# Patient Record
Sex: Female | Born: 1985 | Race: White | Hispanic: No | Marital: Single | Smoking: Never smoker
Health system: Southern US, Community
[De-identification: ages and names within clinical notes are randomized; demographics above are authoritative.]

---
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
## 2005-01-14 ENCOUNTER — Emergency Department (HOSPITAL_COMMUNITY): Admission: EM | Admit: 2005-01-14 | Discharge: 2005-01-14 | Payer: Self-pay | Admitting: Emergency Medicine

## 2008-03-05 ENCOUNTER — Emergency Department (HOSPITAL_BASED_OUTPATIENT_CLINIC_OR_DEPARTMENT_OTHER): Admission: EM | Admit: 2008-03-05 | Discharge: 2008-03-05 | Payer: Self-pay | Admitting: Emergency Medicine

## 2009-01-17 ENCOUNTER — Emergency Department (HOSPITAL_BASED_OUTPATIENT_CLINIC_OR_DEPARTMENT_OTHER): Admission: EM | Admit: 2009-01-17 | Discharge: 2009-01-17 | Payer: Self-pay | Admitting: Emergency Medicine

## 2009-04-04 ENCOUNTER — Emergency Department (HOSPITAL_BASED_OUTPATIENT_CLINIC_OR_DEPARTMENT_OTHER): Admission: EM | Admit: 2009-04-04 | Discharge: 2009-04-04 | Payer: Self-pay | Admitting: Emergency Medicine

## 2009-07-31 ENCOUNTER — Emergency Department (HOSPITAL_BASED_OUTPATIENT_CLINIC_OR_DEPARTMENT_OTHER): Admission: EM | Admit: 2009-07-31 | Discharge: 2009-07-31 | Payer: Self-pay | Admitting: Emergency Medicine

## 2009-08-20 ENCOUNTER — Emergency Department (HOSPITAL_BASED_OUTPATIENT_CLINIC_OR_DEPARTMENT_OTHER): Admission: EM | Admit: 2009-08-20 | Discharge: 2009-08-20 | Payer: Self-pay | Admitting: Emergency Medicine

## 2009-11-22 ENCOUNTER — Emergency Department (HOSPITAL_BASED_OUTPATIENT_CLINIC_OR_DEPARTMENT_OTHER): Admission: EM | Admit: 2009-11-22 | Discharge: 2009-11-22 | Payer: Self-pay | Admitting: Emergency Medicine

## 2009-11-22 ENCOUNTER — Ambulatory Visit: Payer: Self-pay | Admitting: Radiology

## 2009-12-28 ENCOUNTER — Emergency Department (HOSPITAL_BASED_OUTPATIENT_CLINIC_OR_DEPARTMENT_OTHER): Admission: EM | Admit: 2009-12-28 | Discharge: 2009-12-28 | Payer: Self-pay | Admitting: Emergency Medicine

## 2010-01-24 ENCOUNTER — Emergency Department (HOSPITAL_BASED_OUTPATIENT_CLINIC_OR_DEPARTMENT_OTHER): Admission: EM | Admit: 2010-01-24 | Discharge: 2010-01-24 | Payer: Self-pay | Admitting: Emergency Medicine

## 2010-03-05 ENCOUNTER — Ambulatory Visit (HOSPITAL_COMMUNITY)
Admission: RE | Admit: 2010-03-05 | Discharge: 2010-03-05 | Payer: Self-pay | Source: Home / Self Care | Attending: Obstetrics and Gynecology | Admitting: Obstetrics and Gynecology

## 2010-03-06 ENCOUNTER — Emergency Department (HOSPITAL_BASED_OUTPATIENT_CLINIC_OR_DEPARTMENT_OTHER)
Admission: EM | Admit: 2010-03-06 | Discharge: 2010-03-06 | Payer: Self-pay | Source: Home / Self Care | Admitting: Emergency Medicine

## 2010-04-02 ENCOUNTER — Emergency Department (HOSPITAL_BASED_OUTPATIENT_CLINIC_OR_DEPARTMENT_OTHER)
Admission: EM | Admit: 2010-04-02 | Discharge: 2010-04-02 | Payer: Self-pay | Source: Home / Self Care | Admitting: Emergency Medicine

## 2010-04-02 LAB — URINALYSIS, ROUTINE W REFLEX MICROSCOPIC
Bilirubin Urine: NEGATIVE
Hemoglobin, Urine: NEGATIVE
Ketones, ur: NEGATIVE mg/dL
Nitrite: NEGATIVE
Protein, ur: NEGATIVE mg/dL
Specific Gravity, Urine: 1.013 (ref 1.005–1.030)
Urine Glucose, Fasting: NEGATIVE mg/dL
Urobilinogen, UA: 0.2 mg/dL (ref 0.0–1.0)
pH: 6 (ref 5.0–8.0)

## 2010-04-14 ENCOUNTER — Emergency Department (HOSPITAL_BASED_OUTPATIENT_CLINIC_OR_DEPARTMENT_OTHER)
Admission: EM | Admit: 2010-04-14 | Discharge: 2010-04-14 | Payer: Self-pay | Source: Home / Self Care | Admitting: Emergency Medicine

## 2010-06-10 LAB — URINE CULTURE
Colony Count: >=100000
Culture  Setup Time: 201110300231

## 2010-06-10 LAB — URINALYSIS, ROUTINE W REFLEX MICROSCOPIC
Bilirubin Urine: NEGATIVE
Glucose, UA: NEGATIVE mg/dL
Hgb urine dipstick: NEGATIVE
Ketones, ur: NEGATIVE mg/dL
Nitrite: NEGATIVE
Protein, ur: NEGATIVE mg/dL
Specific Gravity, Urine: 1.003 — ABNORMAL LOW (ref 1.005–1.030)
Urobilinogen, UA: 0.2 mg/dL (ref 0.0–1.0)
pH: 6 (ref 5.0–8.0)

## 2010-06-10 LAB — URINE MICROSCOPIC-ADD ON

## 2010-06-11 LAB — URINALYSIS, ROUTINE W REFLEX MICROSCOPIC
Bilirubin Urine: NEGATIVE
Glucose, UA: NEGATIVE mg/dL
Ketones, ur: NEGATIVE mg/dL
Leukocytes, UA: NEGATIVE
Nitrite: NEGATIVE
Protein, ur: NEGATIVE mg/dL
Specific Gravity, Urine: 1.021 (ref 1.005–1.030)
Urobilinogen, UA: 0.2 mg/dL (ref 0.0–1.0)
pH: 6.5 (ref 5.0–8.0)

## 2010-06-11 LAB — URINE MICROSCOPIC-ADD ON

## 2010-06-11 LAB — PREGNANCY, URINE: Preg Test, Ur: NEGATIVE

## 2010-06-14 LAB — RAPID STREP SCREEN (MED CTR MEBANE ONLY): Streptococcus, Group A Screen (Direct): NEGATIVE

## 2010-06-15 ENCOUNTER — Emergency Department (HOSPITAL_BASED_OUTPATIENT_CLINIC_OR_DEPARTMENT_OTHER)
Admission: EM | Admit: 2010-06-15 | Discharge: 2010-06-16 | Disposition: A | Discharge status: Home / Self Care | Payer: Medicaid Other | Attending: Emergency Medicine | Admitting: Emergency Medicine

## 2010-06-15 DIAGNOSIS — R209 Unspecified disturbances of skin sensation: Secondary | ICD-10-CM | POA: Insufficient documentation

## 2010-06-15 DIAGNOSIS — J45909 Unspecified asthma, uncomplicated: Secondary | ICD-10-CM | POA: Insufficient documentation

## 2010-06-15 DIAGNOSIS — O2690 Pregnancy related conditions, unspecified, unspecified trimester: Secondary | ICD-10-CM | POA: Insufficient documentation

## 2010-06-16 LAB — BASIC METABOLIC PANEL
BUN: 4 mg/dL — ABNORMAL LOW (ref 6–23)
CO2: 24 mEq/L (ref 19–32)
Calcium: 8.3 mg/dL — ABNORMAL LOW (ref 8.4–10.5)
Chloride: 105 mEq/L (ref 96–112)
Creatinine, Ser: 0.7 mg/dL (ref 0.4–1.2)
GFR calc Af Amer: >60 mL/min (ref >60)
GFR calc non Af Amer: >60 mL/min (ref >60)
Glucose, Bld: 92 mg/dL (ref 70–99)
Potassium: 4.2 mEq/L (ref 3.5–5.1)
Sodium: 139 mEq/L (ref 135–145)

## 2010-06-16 LAB — PREGNANCY, URINE: Preg Test, Ur: NEGATIVE

## 2010-07-02 LAB — URINALYSIS, ROUTINE W REFLEX MICROSCOPIC
Bilirubin Urine: NEGATIVE
Glucose, UA: NEGATIVE mg/dL
Hgb urine dipstick: NEGATIVE
Ketones, ur: NEGATIVE mg/dL
Nitrite: NEGATIVE
Protein, ur: NEGATIVE mg/dL
Specific Gravity, Urine: 1.004 — ABNORMAL LOW (ref 1.005–1.030)
Urobilinogen, UA: 0.2 mg/dL (ref 0.0–1.0)
pH: 6.5 (ref 5.0–8.0)

## 2010-07-02 LAB — PREGNANCY, URINE: Preg Test, Ur: NEGATIVE
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
## 2010-08-15 ENCOUNTER — Inpatient Hospital Stay (HOSPITAL_COMMUNITY)
Admission: EM | Admit: 2010-08-15 | Discharge: 2010-08-15 | Disposition: A | Discharge status: Home / Self Care | Payer: Medicaid Other | Source: Ambulatory Visit | Attending: Obstetrics & Gynecology | Admitting: Obstetrics & Gynecology

## 2010-08-15 DIAGNOSIS — O9989 Other specified diseases and conditions complicating pregnancy, childbirth and the puerperium: Secondary | ICD-10-CM

## 2010-08-15 DIAGNOSIS — O99891 Other specified diseases and conditions complicating pregnancy: Secondary | ICD-10-CM | POA: Insufficient documentation

## 2010-10-18 ENCOUNTER — Emergency Department (HOSPITAL_BASED_OUTPATIENT_CLINIC_OR_DEPARTMENT_OTHER)
Admission: EM | Admit: 2010-10-18 | Discharge: 2010-10-18 | Disposition: A | Discharge status: Home / Self Care | Payer: Medicaid Other | Attending: Emergency Medicine | Admitting: Emergency Medicine

## 2010-10-18 ENCOUNTER — Encounter: Payer: Self-pay | Admitting: Emergency Medicine

## 2010-10-18 DIAGNOSIS — S01502A Unspecified open wound of oral cavity, initial encounter: Secondary | ICD-10-CM

## 2010-10-18 DIAGNOSIS — K1370 Unspecified lesions of oral mucosa: Secondary | ICD-10-CM | POA: Insufficient documentation

## 2010-10-18 DIAGNOSIS — X58XXXA Exposure to other specified factors, initial encounter: Secondary | ICD-10-CM | POA: Insufficient documentation

## 2010-10-18 NOTE — ED Provider Notes (Signed)
History     Chief Complaint  Patient presents with  . Mouth Lesions   HPI Comments: Patient is concerned that she may have oral thrush. Today she is noted possible lesions on her tongue. There is no white coating that she can scrape off. She is able to swallow without difficulty. She has no shortness of breath no feelings of throat closing. She otherwise is completely well.  Patient is a 25 y.o. female presenting with mouth sores. The history is provided by the patient.  Mouth Lesions  The current episode started today. The problem has been unchanged. The problem is mild. The symptoms are relieved by nothing. The symptoms are aggravated by nothing. Associated symptoms include mouth sores. Pertinent negatives include no orthopnea, no fever, no abdominal pain, no diarrhea, no nausea, no vomiting, no headaches, no cough, no URI, no wheezing, no rash, no eye discharge and no eye redness.    Past Medical History  Diagnosis Date  . Endometriosis     Past Surgical History  Procedure Date  . Cesarean section     No family history on file.  History  Substance Use Topics  . Smoking status: Never Smoker   . Smokeless tobacco: Not on file  . Alcohol Use: No    OB History    Grav Para Term Preterm Abortions TAB SAB Ect Mult Living                  Review of Systems  Constitutional: Negative.  Negative for fever and chills.  HENT: Positive for mouth sores.   Eyes: Negative.  Negative for discharge and redness.  Respiratory: Negative.  Negative for cough, shortness of breath and wheezing.   Cardiovascular: Negative.  Negative for chest pain and orthopnea.  Gastrointestinal: Negative.  Negative for nausea, vomiting, abdominal pain and diarrhea.  Genitourinary: Negative.  Negative for dysuria and vaginal discharge.  Musculoskeletal: Negative.  Negative for back pain.  Skin: Negative.  Negative for color change and rash.  Neurological: Negative.  Negative for syncope and headaches.    Hematological: Negative.  Negative for adenopathy.  Psychiatric/Behavioral: Negative.  Negative for confusion.  All other systems reviewed and are negative.    Physical Exam  BP 109/64  Pulse 72  Temp(Src) 98 F (36.7 C) (Oral)  Resp 16  Ht 5\' 6"  (1.676 m)  Wt 149 lb (67.586 kg)  BMI 24.05 kg/m2  SpO2 99%  LMP 10/08/2010  Physical Exam  Constitutional: She is oriented to person, place, and time. She appears well-developed and well-nourished.  HENT:  Head: Normocephalic and atraumatic.       Patient has no white coating across her tongue. No vesicular lesions. No signs of dental infection along her gums. Oropharynx is clear. Uvula midline. No tonsillar exudate or swelling.  Eyes: Conjunctivae and EOM are normal. Pupils are equal, round, and reactive to light.  Neck: Normal range of motion. Neck supple.  Pulmonary/Chest: Effort normal.  Musculoskeletal: Normal range of motion.  Neurological: She is alert and oriented to person, place, and time.  Skin: Skin is warm and dry.  Psychiatric: She has a normal mood and affect. Her behavior is normal. Judgment and thought content normal.    ED Course  Procedures  MDM The patient has no signs of oral thrush at this time. She is otherwise completely well. No signs of any respiratory complaints or distress. I've advised her to followup with her primary care physician or with the dentist.  Nat Christen, MD 10/18/10 [phone]

## 2010-10-18 NOTE — ED Notes (Signed)
Pt c/o "coating" to mouth- denies pain- sts she thinks it is yeast

## 2010-11-22 ENCOUNTER — Encounter (HOSPITAL_BASED_OUTPATIENT_CLINIC_OR_DEPARTMENT_OTHER): Payer: Self-pay | Admitting: *Deleted

## 2010-11-22 ENCOUNTER — Emergency Department (HOSPITAL_BASED_OUTPATIENT_CLINIC_OR_DEPARTMENT_OTHER)
Admission: EM | Admit: 2010-11-22 | Discharge: 2010-11-22 | Disposition: A | Discharge status: Home / Self Care | Payer: Medicaid Other | Attending: Emergency Medicine | Admitting: Emergency Medicine

## 2010-11-22 DIAGNOSIS — H5710 Ocular pain, unspecified eye: Secondary | ICD-10-CM | POA: Insufficient documentation

## 2010-11-22 DIAGNOSIS — H00019 Hordeolum externum unspecified eye, unspecified eyelid: Secondary | ICD-10-CM | POA: Insufficient documentation

## 2010-11-22 DIAGNOSIS — H5789 Other specified disorders of eye and adnexa: Secondary | ICD-10-CM | POA: Insufficient documentation

## 2010-11-22 MED ORDER — CEPHALEXIN 500 MG PO CAPS: 500.0000 mg | ORAL_CAPSULE | Freq: Four times a day (QID) | ORAL | Status: AC

## 2010-11-22 MED ORDER — TETRACAINE HCL 0.5 % OP SOLN
OPHTHALMIC | Status: AC
  Administered 2010-11-22: 2 [drp] via OPHTHALMIC
  Filled 2010-11-22: qty 2

## 2010-11-22 MED ORDER — LIDOCAINE HCL (PF) 1 % IJ SOLN
INTRAMUSCULAR | Status: AC
  Administered 2010-11-22: 5 mL
  Filled 2010-11-22: qty 5

## 2010-11-22 MED ORDER — TOBRAMYCIN 0.3 % OP SOLN: 1.0000 [drp] | OPHTHALMIC | Status: AC

## 2010-11-22 NOTE — ED Provider Notes (Signed)
History     CSN: [id number] Arrival date & time: 11/22/2010  7:49 PM  Chief Complaint  Patient presents with  . Stye   Patient is a 25 y.o. female presenting with eye problem. The history is provided by the patient. No language interpreter was used.  Eye Problem  This is a new problem. The current episode started more than 2 days ago. The problem occurs constantly. The problem has been gradually worsening. There is pain in the left eye. There was no injury mechanism. The pain is at a severity of 6/10. The pain is moderate. There is no history of trauma to the eye. There is no known exposure to pink eye. She does not wear contacts. Pertinent negatives include no blurred vision, no decreased vision and no discharge. She has tried eye drops for the symptoms. The treatment provided no relief.  Pt has a stye to left eye, Pt complains of pain and swelling  Past Medical History  Diagnosis Date  . Endometriosis     Past Surgical History  Procedure Date  . Cesarean section     History reviewed. No pertinent family history.  History  Substance Use Topics  . Smoking status: Never Smoker   . Smokeless tobacco: Not on file  . Alcohol Use: No    OB History    Grav Para Term Preterm Abortions TAB SAB Ect Mult Living                  Review of Systems  Eyes: Positive for pain. Negative for blurred vision and discharge.  All other systems reviewed and are negative.    Physical Exam  BP 114/76  Pulse 73  Temp(Src) 98.2 F (36.8 C) (Oral)  Resp 22  SpO2 100%  Physical Exam  Nursing note and vitals reviewed. Constitutional: She appears well-developed and well-nourished.  HENT:  Head: Normocephalic and atraumatic.  Mouth/Throat: Oropharynx is clear and moist.  Eyes: Conjunctivae and EOM are normal. Pupils are equal, round, and reactive to light.       Stye left eyelid, pointing abscess   Neck: Normal range of motion.  Cardiovascular: Normal rate.   Pulmonary/Chest: Effort  normal.  Abdominal: Soft.  Musculoskeletal: Normal range of motion.  Neurological: She is alert.  Skin: Skin is warm.  Psychiatric: She has a normal mood and affect.    ED Course  INCISION AND DRAINAGE Date/Time: 11/22/2010 10:25 PM Performed by: Langston Masker Authorized by: Langston Masker Consent: Verbal consent obtained. Risks and benefits: risks, benefits and alternatives were discussed Consent given by: patient Patient understanding: patient states understanding of the procedure being performed Patient identity confirmed: verbally with patient Time out: Immediately prior to procedure a "time out" was called to verify the correct patient, procedure, equipment, support staff and site/side marked as required. Type: abscess Anesthesia: local infiltration Local anesthetic: lidocaine 1% without epinephrine Anesthetic total: 0.5 ml Patient sedated: no Scalpel size: 11 Incision type: single straight Complexity: simple Drainage: purulent Drainage amount: moderate Wound treatment: wound left open Patient tolerance: Patient tolerated the procedure well with no immediate complications.    MDM Dr. Ignacia Palma in to see and examine,     Medical screening examination/treatment/procedure(s) were conducted as a shared visit with non-physician practitioner(s) and myself.  I personally evaluated the patient during the encounter Osvaldo Human, M.D.   [place], [place] 11/22/10 2227  Langston Masker, [place] 11/22/10 2228  Carleene Cooper III, MD 11/23/10 [phone]

## 2010-11-22 NOTE — ED Notes (Signed)
Assisted Verline Lema, EDPA with I&D of left lower eye lid

## 2010-11-22 NOTE — ED Notes (Signed)
Pt began having left eye pain and developed a stye pt has been usinf stye medication and warm compresses with no  relief

## 2010-11-25 LAB — CULTURE, ROUTINE-ABSCESS

## 2010-11-25 NOTE — ED Notes (Addendum)
Results called from [facility]. Abscess, eye -> Abundant MRSA.  Rx for Cephalexin -> no sensitivity for this drug provided.  Rx for Tobramycin Eye drops.  Chart to MD office for review.  FYI flag for MRSA set.

## 2010-11-26 NOTE — ED Notes (Signed)
Chart back from EDP, +MRSA abscess (L eye). Rx for Doxycycline 100 mg - 1 PO BID x 10 Days, then recheck in ED.

## 2010-11-30 NOTE — ED Notes (Signed)
Per [facility] pharmacist Doxycycline that was prescribed can cross into breast milk & pt is nursing, so Bactrim DS was prescribed by Ebbie Ridge PA, 2 tab BID x 10 days, called into [facility] at [phone]

## 2011-01-01 LAB — WET PREP, GENITAL
Trich, Wet Prep: NONE SEEN
Yeast Wet Prep HPF POC: NONE SEEN

## 2011-01-01 LAB — URINALYSIS, ROUTINE W REFLEX MICROSCOPIC
Bilirubin Urine: NEGATIVE
Glucose, UA: NEGATIVE mg/dL
Ketones, ur: NEGATIVE mg/dL
Leukocytes, UA: NEGATIVE
Nitrite: NEGATIVE
Protein, ur: NEGATIVE mg/dL
Specific Gravity, Urine: 1.028 (ref 1.005–1.030)
Urobilinogen, UA: 0.2 mg/dL (ref 0.0–1.0)
pH: 6 (ref 5.0–8.0)

## 2011-01-01 LAB — GC/CHLAMYDIA PROBE AMP, GENITAL
Chlamydia, DNA Probe: NEGATIVE
GC Probe Amp, Genital: NEGATIVE

## 2011-01-01 LAB — URINE MICROSCOPIC-ADD ON

## 2011-01-01 LAB — RPR: RPR Ser Ql: NONREACTIVE

## 2011-01-01 LAB — PREGNANCY, URINE: Preg Test, Ur: NEGATIVE

## 2011-02-28 IMAGING — US US OB NUCHAL TRANSLUCENCY 1ST GEST
1 series · 13 of 13 positions shown · non-contrast
Comparison: none

[Series 1: us ob nuchal translucency 1st gest · 13 of 13 slices shown]
[im 1/13]
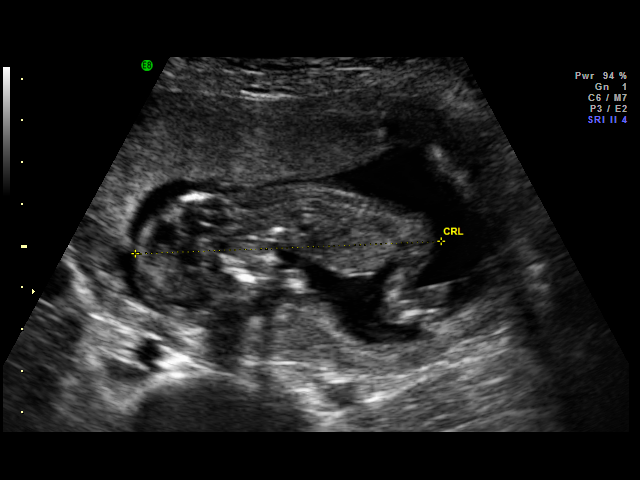
[im 2/13]
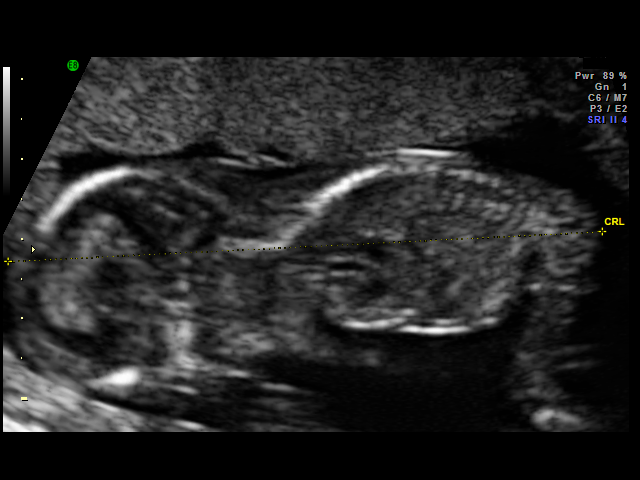
[im 3/13]
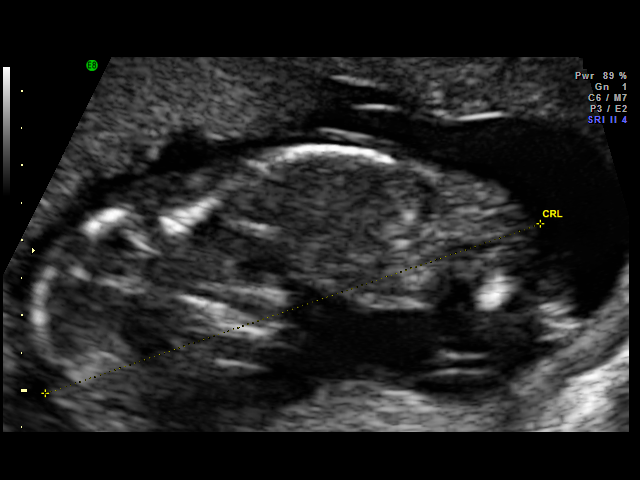
[im 4/13]
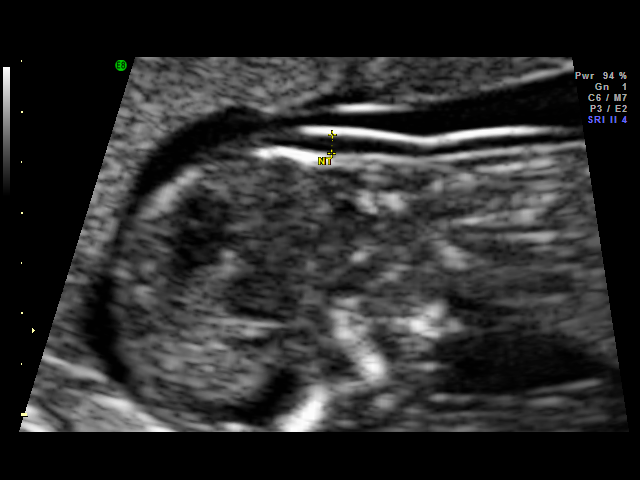
[im 5/13]
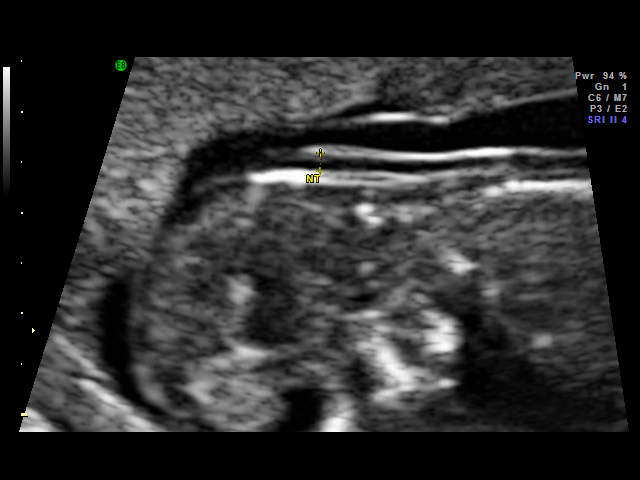
[im 6/13]
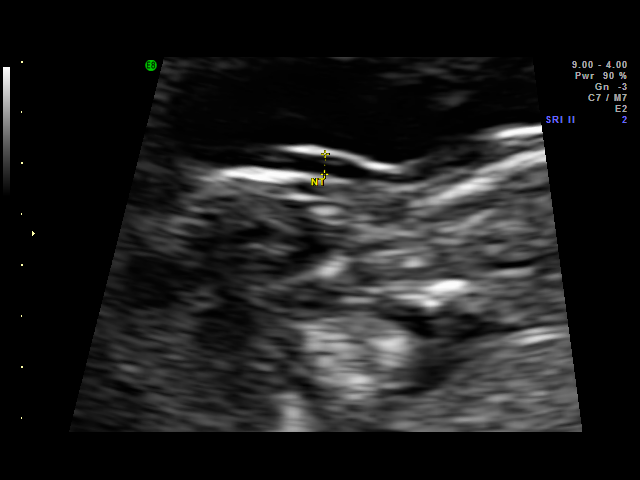
[im 7/13]
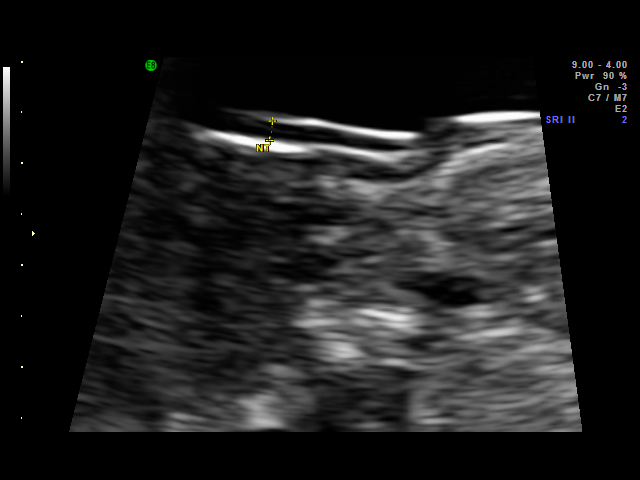
[im 8/13]
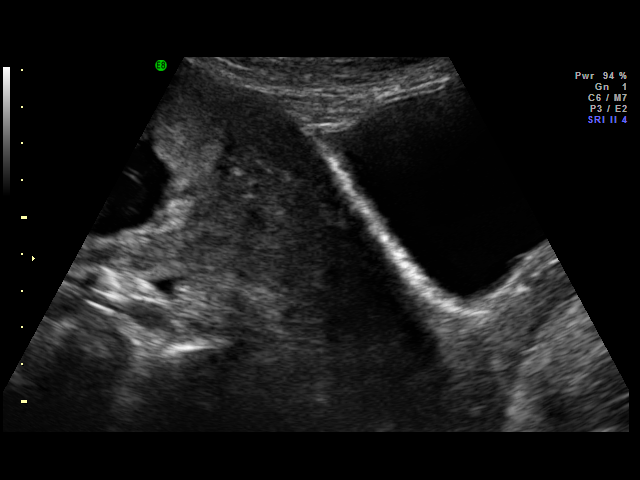
[im 9/13]
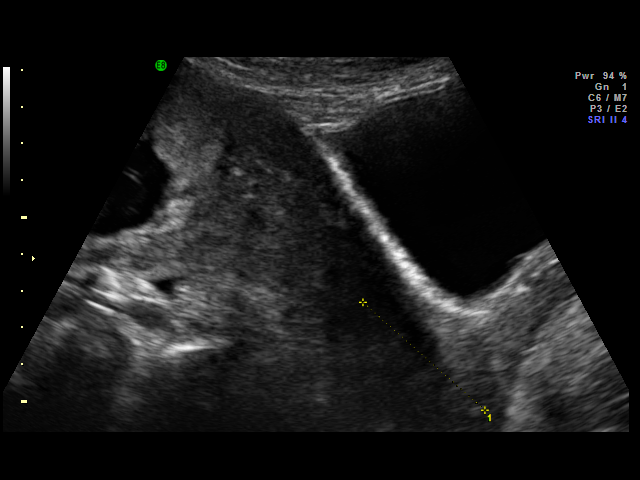
[im 10/13]
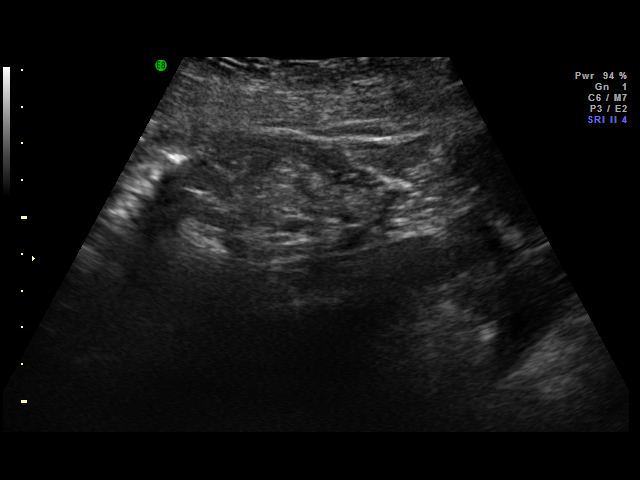
[im 11/13]
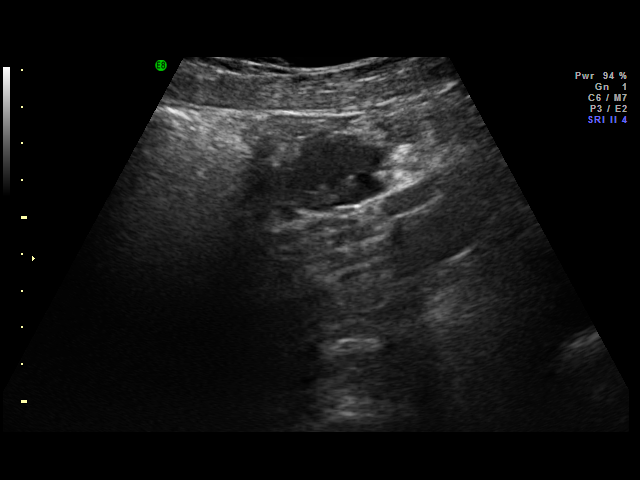
[im 12/13]
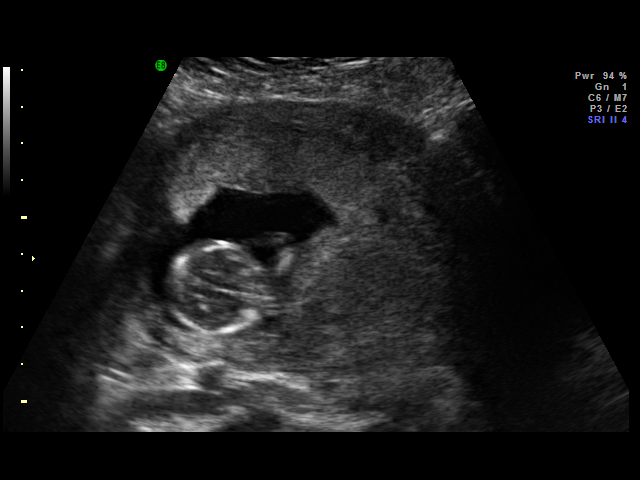
[im 13/13]
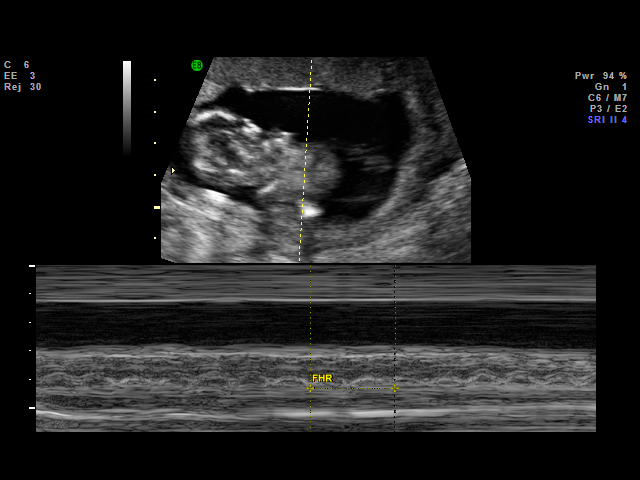

[13 of 13 positions shown; findings below may reference images not displayed]

OBSTETRICS REPORT
                      (Signed Final 03/05/2010 [DATE])

 Order#:         _O
Procedures

 US FETAL NUCHAL TRANSLUCENCY                          76813.0
 MEASUREMENT
Indications

 First trimester aneuploidy screen (NT)
 PRIOR C/SECTION x2
Fetal Evaluation

 Fetal Heart Rate:  138                          bpm
 Cardiac Activity:  Observed
 Presentation:      Transverse, head to
                    maternal right
 Placenta:          Anterior, above cervical os

 Amniotic Fluid
 AFI FV:      Subjectively within normal limits
Biometry

 CRL:     72.6  mm     G. Age:  13w 1d                 EDD:    09/09/10

 NT:       1.8  mm
Gestational Age

 Clinical EDD:  12w 6d                                        EDD:   09/11/10
 Best:          12w 6d     Det. By:  Clinical EDD             EDD:   09/11/10
Cervix Uterus Adnexa

 Cervical Length:    4.4      cm

 Cervix:       Normal appearance by transabdominal scan.
 Uterus:       No abnormality visualized.
 Left Ovary:    Not visualized.
 Right Ovary:   Not visualized.

 Adnexa:     No adnexal mass visualized.
Impression

 IUP at 12+6 weeks
 No gross abnormalities identified
 NT measurement was within normal limits for this GA
 Normal amniotic fluid volume
 Measurements consistant with stated EDC

 Patient opted for First Screen.
Recommendations

 Offer only MSAFP in the second trimester
 Offer level II U/S by 18 weeks

## 2011-09-14 ENCOUNTER — Encounter (HOSPITAL_BASED_OUTPATIENT_CLINIC_OR_DEPARTMENT_OTHER): Payer: Self-pay

## 2011-09-14 ENCOUNTER — Emergency Department (HOSPITAL_BASED_OUTPATIENT_CLINIC_OR_DEPARTMENT_OTHER)
Admission: EM | Admit: 2011-09-14 | Discharge: 2011-09-14 | Disposition: A | Discharge status: Home / Self Care | Payer: Medicaid Other | Attending: Emergency Medicine | Admitting: Emergency Medicine

## 2011-09-14 ENCOUNTER — Emergency Department (HOSPITAL_BASED_OUTPATIENT_CLINIC_OR_DEPARTMENT_OTHER): Payer: Medicaid Other

## 2011-09-14 DIAGNOSIS — S0993XA Unspecified injury of face, initial encounter: Secondary | ICD-10-CM | POA: Insufficient documentation

## 2011-09-14 DIAGNOSIS — M549 Dorsalgia, unspecified: Secondary | ICD-10-CM

## 2011-09-14 DIAGNOSIS — S46819A Strain of other muscles, fascia and tendons at shoulder and upper arm level, unspecified arm, initial encounter: Secondary | ICD-10-CM

## 2011-09-14 MED ORDER — IBUPROFEN 800 MG PO TABS: 800.0000 mg | ORAL_TABLET | Freq: Three times a day (TID) | ORAL | Status: AC

## 2011-09-14 MED ORDER — HYDROCODONE-ACETAMINOPHEN 5-325 MG PO TABS: 2.0000 | ORAL_TABLET | ORAL | Status: AC | PRN

## 2011-09-14 NOTE — ED Notes (Signed)
Report received from prior shift.

## 2011-09-14 NOTE — ED Notes (Signed)
Patient transported to X-ray 
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
## 2011-09-14 NOTE — Discharge Instructions (Signed)
Back Pain, Adult Low back pain is very common. About 1 in 5 people have back pain.The cause of low back pain is rarely dangerous. The pain often gets better over time.About half of people with a sudden onset of back pain feel better in just 2 weeks. About 8 in 10 people feel better by 6 weeks.  CAUSES Some common causes of back pain include:  Strain of the muscles or ligaments supporting the spine.   Wear and tear (degeneration) of the spinal discs.   Arthritis.   Direct injury to the back.  DIAGNOSIS Most of the time, the direct cause of low back pain is not known.However, back pain can be treated effectively even when the exact cause of the pain is unknown.Answering your caregiver's questions about your overall health and symptoms is one of the most accurate ways to make sure the cause of your pain is not dangerous. If your caregiver needs more information, he or she may order lab work or imaging tests (X-rays or MRIs).However, even if imaging tests show changes in your back, this usually does not require surgery. HOME CARE INSTRUCTIONS For many people, back pain returns.Since low back pain is rarely dangerous, it is often a condition that people can learn to manageon their own.   Remain active. It is stressful on the back to sit or stand in one place. Do not sit, drive, or stand in one place for more than 30 minutes at a time. Take short walks on level surfaces as soon as pain allows.Try to increase the length of time you walk each day.   Do not stay in bed.Resting more than 1 or 2 days can delay your recovery.   Do not avoid exercise or work.Your body is made to move.It is not dangerous to be active, even though your back may hurt.Your back will likely heal faster if you return to being active before your pain is gone.   Pay attention to your body when you bend and lift. Many people have less discomfortwhen lifting if they bend their knees, keep the load close to their  bodies,and avoid twisting. Often, the most comfortable positions are those that put less stress on your recovering back.   Find a comfortable position to sleep. Use a firm mattress and lie on your side with your knees slightly bent. If you lie on your back, put a pillow under your knees.   Only take over-the-counter or prescription medicines as directed by your caregiver. Over-the-counter medicines to reduce pain and inflammation are often the most helpful.Your caregiver may prescribe muscle relaxant drugs.These medicines help dull your pain so you can more quickly return to your normal activities and healthy exercise.   Put ice on the injured area.   Put ice in a plastic bag.   Place a towel between your skin and the bag.   Leave the ice on for 15 to 20 minutes, 3 to 4 times a day for the first 2 to 3 days. After that, ice and heat may be alternated to reduce pain and spasms.   Ask your caregiver about trying back exercises and gentle massage. This may be of some benefit.   Avoid feeling anxious or stressed.Stress increases muscle tension and can worsen back pain.It is important to recognize when you are anxious or stressed and learn ways to manage it.Exercise is a great option.  SEEK MEDICAL CARE IF:  You have pain that is not relieved with rest or medicine.   You have   pain that does not improve in 1 week.   You have new symptoms.   You are generally not feeling well.  SEEK IMMEDIATE MEDICAL CARE IF:   You have pain that radiates from your back into your legs.   You develop new bowel or bladder control problems.   You have unusual weakness or numbness in your arms or legs.   You develop nausea or vomiting.   You develop abdominal pain.   You feel faint.  Document Released: 03/15/2005 Document Revised: 03/04/2011 Document Reviewed: 08/03/2010 [employer] Patient Information 2012 [employer]. 
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
## 2011-09-15 NOTE — ED Provider Notes (Signed)
Medical screening examination/treatment/procedure(s) were performed by non-physician practitioner and as supervising physician I was immediately available for consultation/collaboration.   Kaspar Albornoz, MD 09/15/11 2313 

## 2012-01-23 ENCOUNTER — Emergency Department (HOSPITAL_BASED_OUTPATIENT_CLINIC_OR_DEPARTMENT_OTHER): Payer: No Typology Code available for payment source

## 2012-01-23 ENCOUNTER — Encounter (HOSPITAL_BASED_OUTPATIENT_CLINIC_OR_DEPARTMENT_OTHER): Payer: Self-pay

## 2012-01-23 ENCOUNTER — Emergency Department (HOSPITAL_BASED_OUTPATIENT_CLINIC_OR_DEPARTMENT_OTHER)
Admission: EM | Admit: 2012-01-23 | Discharge: 2012-01-23 | Disposition: A | Discharge status: Home / Self Care | Payer: No Typology Code available for payment source | Attending: Emergency Medicine | Admitting: Emergency Medicine

## 2012-01-23 DIAGNOSIS — N809 Endometriosis, unspecified: Secondary | ICD-10-CM | POA: Insufficient documentation

## 2012-01-23 DIAGNOSIS — Y9389 Activity, other specified: Secondary | ICD-10-CM | POA: Insufficient documentation

## 2012-01-23 DIAGNOSIS — M545 Low back pain, unspecified: Secondary | ICD-10-CM | POA: Insufficient documentation

## 2012-01-23 DIAGNOSIS — M25519 Pain in unspecified shoulder: Secondary | ICD-10-CM | POA: Insufficient documentation

## 2012-01-23 DIAGNOSIS — Z79899 Other long term (current) drug therapy: Secondary | ICD-10-CM | POA: Insufficient documentation

## 2012-01-23 LAB — URINALYSIS, ROUTINE W REFLEX MICROSCOPIC
Glucose, UA: NEGATIVE mg/dL
Hgb urine dipstick: NEGATIVE
Specific Gravity, Urine: 1.024 (ref 1.005–1.030)
Urobilinogen, UA: 0.2 mg/dL (ref 0.0–1.0)
pH: 5.5 (ref 5.0–8.0)

## 2012-01-23 LAB — URINE MICROSCOPIC-ADD ON

## 2012-01-23 LAB — PREGNANCY, URINE: Preg Test, Ur: NEGATIVE

## 2012-01-23 MED ORDER — METHOCARBAMOL 750 MG PO TABS: 750.0000 mg | ORAL_TABLET | Freq: Four times a day (QID) | ORAL | Status: AC

## 2012-01-23 MED ORDER — MELOXICAM 15 MG PO TABS: 15.0000 mg | ORAL_TABLET | Freq: Every day | ORAL | Status: AC
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
## 2012-01-23 NOTE — ED Notes (Signed)
Returned from xray
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
## 2012-01-25 LAB — URINE CULTURE

## 2012-01-26 NOTE — ED Provider Notes (Signed)
Medical screening examination/treatment/procedure(s) were performed by non-physician practitioner and as supervising physician I was immediately available for consultation/collaboration.  Ethelda Chick, MD 01/26/12 1739

## 2012-09-10 ENCOUNTER — Emergency Department (HOSPITAL_BASED_OUTPATIENT_CLINIC_OR_DEPARTMENT_OTHER): Payer: BC Managed Care – PPO

## 2012-09-10 ENCOUNTER — Emergency Department (HOSPITAL_BASED_OUTPATIENT_CLINIC_OR_DEPARTMENT_OTHER)
Admission: EM | Admit: 2012-09-10 | Discharge: 2012-09-10 | Disposition: A | Discharge status: Home / Self Care | Payer: BC Managed Care – PPO | Attending: Emergency Medicine | Admitting: Emergency Medicine

## 2012-09-10 ENCOUNTER — Encounter (HOSPITAL_BASED_OUTPATIENT_CLINIC_OR_DEPARTMENT_OTHER): Payer: Self-pay | Admitting: *Deleted

## 2012-09-10 DIAGNOSIS — Y9389 Activity, other specified: Secondary | ICD-10-CM | POA: Insufficient documentation

## 2012-09-10 DIAGNOSIS — Z8742 Personal history of other diseases of the female genital tract: Secondary | ICD-10-CM | POA: Insufficient documentation

## 2012-09-10 DIAGNOSIS — Y929 Unspecified place or not applicable: Secondary | ICD-10-CM | POA: Insufficient documentation

## 2012-09-10 DIAGNOSIS — IMO0002 Reserved for concepts with insufficient information to code with codable children: Secondary | ICD-10-CM | POA: Insufficient documentation

## 2012-09-10 DIAGNOSIS — X503XXA Overexertion from repetitive movements, initial encounter: Secondary | ICD-10-CM | POA: Insufficient documentation

## 2012-09-10 DIAGNOSIS — S53401A Unspecified sprain of right elbow, initial encounter: Secondary | ICD-10-CM

## 2012-09-10 NOTE — ED Provider Notes (Signed)
History     CSN: [id number]  Arrival date & time 09/10/12  1022   First MD Initiated Contact with Patient 09/10/12 1200      Chief Complaint  Patient presents with  . Arm Injury    (Consider location/radiation/quality/duration/timing/severity/associated sxs/prior treatment) HPI Comments: Lifted child and felt a pop in the elbow.  Pain since.  Worse with range of motion, palpation.  Patient is a 27 y.o. female presenting with arm injury. The history is provided by the patient.  Arm Injury Location:  Elbow Injury: yes   Elbow location:  R elbow Pain details:    Quality:  Sharp   Radiates to:  Does not radiate   Severity:  Moderate   Onset quality:  Sudden   Duration:  1 day   Timing:  Constant   Progression:  Unchanged Chronicity:  New   Past Medical History  Diagnosis Date  . Endometriosis     Past Surgical History  Procedure Laterality Date  . Cesarean section      No family history on file.  History  Substance Use Topics  . Smoking status: Never Smoker   . Smokeless tobacco: Not on file  . Alcohol Use: No    OB History   Grav Para Term Preterm Abortions TAB SAB Ect Mult Living                  Review of Systems  All other systems reviewed and are negative.    Allergies  Benadryl; Stadol; and Latex  Home Medications   Current Outpatient Rx  Name  Route  Sig  Dispense  Refill  . meloxicam (MOBIC) 15 MG tablet   Oral   Take 1 tablet (15 mg total) by mouth daily.   10 tablet   0   . methocarbamol (ROBAXIN) 750 MG tablet   Oral   Take 1 tablet (750 mg total) by mouth 4 (four) times daily.   20 tablet   0     BP 109/75  Pulse 73  Temp(Src) 98.4 F (36.9 C) (Oral)  Resp 20  Wt 145 lb (65.772 kg)  BMI 23.41 kg/m2  SpO2 100%  Physical Exam  Nursing note and vitals reviewed. Constitutional: She is oriented to person, place, and time. She appears well-developed and well-nourished. No distress.  HENT:  Head: Normocephalic and  atraumatic.  Mouth/Throat: Oropharynx is clear and moist.  Neck: Normal range of motion. Neck supple.  Musculoskeletal:  The right elbow appears grossly normal without swelling or erythema.  There is pain with range of motion but no crepitus.  The distal pulses are intact and motor is intact as well.   Neurological: She is alert and oriented to person, place, and time.  Skin: Skin is warm and dry. She is not diaphoretic.    ED Course  Procedures (including critical care time)  Labs Reviewed - No data to display Dg Elbow Complete Right  09/10/2012   *RADIOLOGY REPORT*  Clinical Data: Injury  RIGHT ELBOW - COMPLETE 3+ VIEW  Comparison: None.  Findings: Four views of the right elbow submitted.  No acute fracture or subluxation.  No posterior fat pad sign.  IMPRESSION: No acute fracture or subluxation.   Original Report Authenticated By: Natasha Mead, M.D.     1. Elbow sprain, right, initial encounter       MDM  Elbow strain.  Will treat with sling, nsaids.  Follow up prn.        Geoffery Lyons, MD  09/10/12 1500 

## 2012-09-10 NOTE — Discharge Instructions (Signed)
Ibuprofen 600 mg every six hours as needed for pain.  Elbow Injury You or your child has an elbow injury. X-rays and exam today do not show evidence of a fracture (broken bone). That means that only a sling or splint may be required for a brief period of time as directed by your caregiver. HOME CARE INSTRUCTIONS  Only take over-the-counter or prescription medicines for pain, discomfort, or fever as directed by your caregiver.  If you have a splint held on with an elastic wrap or a cast, watch your hand or fingers. If they become numb or cold and blue, loosen the wrap and reapply more loosely. See your caregiver if there is no relief.  You may use ice on your elbow for 15-20 minutes, 3-4 times per day, for the first 2 to 3 days.  Use your elbow as directed.  See your caregiver as directed. It is very important to keep all follow-up referrals and appointments in order to avoid any long-term problems with your elbow including chronic pain or inability to move the elbow normally. SEEK IMMEDIATE MEDICAL CARE IF:   There is swelling or increasing pain in your elbow which is not relieved with medications.  You begin to lose feeling in your hand or fingers, or develop swelling of the hand and fingers.  You get a cold or blue hand or fingers on injured side.  If your elbow remains sore, your caregiver may want to x-ray it again. A hairline fracture may not show up on the first x-rays and may only be seen on repeat x-rays ten days to two weeks later. A specialist (radiologist) may examine your x-rays at a later time. In order to get results from the radiologist or their department, make sure you know how and when you are to get that information. It is your responsibility to get results of any tests you may have had. MAKE SURE YOU:   Understand these instructions.  Will watch your condition.  Will get help right away if you are not doing well or get worse. Document Released: 06/05/2003 Document  Revised: 06/07/2011 Document Reviewed: 11/01/2007 [facility] Patient Information 2014 [place], [place].

## 2012-09-10 NOTE — ED Notes (Signed)
Carrying daughter yesterday and felt her elbow "pop". Now having pain and swelling

## 2016-09-01 ENCOUNTER — Emergency Department (HOSPITAL_BASED_OUTPATIENT_CLINIC_OR_DEPARTMENT_OTHER)
Admission: EM | Admit: 2016-09-01 | Discharge: 2016-09-01 | Disposition: A | Discharge status: Home / Self Care | Payer: BLUE CROSS/BLUE SHIELD | Attending: Emergency Medicine | Admitting: Emergency Medicine

## 2016-09-01 ENCOUNTER — Encounter (HOSPITAL_BASED_OUTPATIENT_CLINIC_OR_DEPARTMENT_OTHER): Payer: Self-pay | Admitting: *Deleted

## 2016-09-01 DIAGNOSIS — Z91040 Latex allergy status: Secondary | ICD-10-CM | POA: Diagnosis not present

## 2016-09-01 DIAGNOSIS — R220 Localized swelling, mass and lump, head: Secondary | ICD-10-CM | POA: Diagnosis present

## 2016-09-01 DIAGNOSIS — Z79899 Other long term (current) drug therapy: Secondary | ICD-10-CM | POA: Insufficient documentation

## 2016-09-01 DIAGNOSIS — K148 Other diseases of tongue: Secondary | ICD-10-CM | POA: Diagnosis not present

## 2016-09-01 DIAGNOSIS — Q383 Other congenital malformations of tongue: Secondary | ICD-10-CM

## 2016-09-01 NOTE — ED Provider Notes (Signed)
[facility] Note   CSN: [id number] Arrival date & time: 09/01/16  [id number]     History   Chief Complaint No chief complaint on file.   HPI [name] is a 31 y.o. female.She noticed swelling on the inferior surface of her tongue, left side yesterday. Area is nonpainful. She has no difficulty swallowing or breathing or speaking. She does not feel ill. No other associated symptoms. No treatment prior to coming here. Nothing makes symptoms better or worse  HPI  Past Medical History:  Diagnosis Date  . Endometriosis   Completed course of Diflucan yesterday for yeast infection  There are no active problems to display for this patient.   Past Surgical History:  Procedure Laterality Date  . CESAREAN SECTION      OB History    No data available       Home Medications    Prior to Admission medications   Medication Sig Start Date End Date Taking? Authorizing Provider  meloxicam (MOBIC) 15 MG tablet Take 1 tablet (15 mg total) by mouth daily. 01/23/12   Arthor CaptainHarris, Abigail, PA-C  methocarbamol (ROBAXIN) 750 MG tablet Take 1 tablet (750 mg total) by mouth 4 (four) times daily. 01/23/12   Arthor CaptainHarris, Abigail, PA-C    Family History No family history on file.  Social History Social History  Substance Use Topics  . Smoking status: Never Smoker  . Smokeless tobacco: Not on file  . Alcohol use No  No alcohol no drug use   Allergies   Benadryl [diphenhydramine hcl]; Stadol [butorphanol tartrate]; and Latex   Review of Systems Review of Systems  Constitutional: Negative.   HENT:       Tongue lesion  Respiratory: Negative.   Endocrine: Negative.   Allergic/Immunologic: Negative.   Psychiatric/Behavioral: Negative.      Physical Exam Updated Vital Signs BP 117/78 (BP Location: Right Arm)   Pulse 65   Temp 98.6 F (37 C) (Oral)   Resp 18   Ht 5\' 6"  (1.676 m)   Wt 74.4 kg (164 lb)   SpO2 98%   BMI 26.47 kg/m   Physical Exam    Constitutional: She is oriented to person, place, and time. She appears well-developed and well-nourished. No distress.  HENT:  Head: Normocephalic and atraumatic.  Tongue is not swollen. There is a cystic appearing lesion at inferior surface of tongue along sublingual gland. Nontender.. Handling secretions well.  Eyes: EOM are normal. Pupils are equal, round, and reactive to light.  Neck: Neck supple.  Cardiovascular: Normal rate.   Pulmonary/Chest: Effort normal.  Abdominal: She exhibits no distension.  Musculoskeletal: Normal range of motion.  Lymphadenopathy:    She has no cervical adenopathy.  Neurological: She is alert and oriented to person, place, and time. No cranial nerve deficit.  Skin: Skin is warm and dry. No rash noted.  Psychiatric: She has a normal mood and affect.  Nursing note and vitals reviewed.    ED Treatments / Results  Labs (all labs ordered are listed, but only abnormal results are displayed) Labs Reviewed - No data to display  EKG  EKG Interpretation None       Radiology No results found.  Procedures Procedures (including critical care time)  Medications Ordered in ED Medications - No data to display   Initial Impression / Assessment and Plan / ED Course  I have reviewed the triage vital signs and the nursing notes.  Pertinent labs & imaging results that were available during my  care of the patient were reviewed by me and considered in my medical decision making (see chart for details).     Patient may have blocked salivary duct. I suggest sucking on hard candy and provided patient referral to Dr. Annalee Genta, ENT specialist. Suggest follow-up appointment within 1-2 weeks  Final Clinical Impressions(s) / ED Diagnoses   Final diagnoses:  Tongue abnormality    New Prescriptions New Prescriptions   No medications on file     Doug Sou, MD 09/01/16 [phone]

## 2016-09-01 NOTE — ED Triage Notes (Signed)
Pt recently finished a course of diflucan and yesterday she noticed a lump on the back, left side of her tongue. Pt denies pain or difficulty swallowing.

## 2016-09-01 NOTE — Discharge Instructions (Signed)
Suck on hard sour candy several times daily. Call Dr. Thurmon FairShoemaker's office to schedule appointment for within the next one or 2 weeks

## 2018-12-11 ENCOUNTER — Emergency Department (HOSPITAL_BASED_OUTPATIENT_CLINIC_OR_DEPARTMENT_OTHER)
Admission: EM | Admit: 2018-12-11 | Discharge: 2018-12-11 | Disposition: A | Discharge status: Home / Self Care | Payer: BLUE CROSS/BLUE SHIELD | Attending: Emergency Medicine | Admitting: Emergency Medicine

## 2018-12-11 ENCOUNTER — Other Ambulatory Visit: Payer: Self-pay

## 2018-12-11 ENCOUNTER — Encounter (HOSPITAL_BASED_OUTPATIENT_CLINIC_OR_DEPARTMENT_OTHER): Payer: Self-pay | Admitting: Emergency Medicine

## 2018-12-11 DIAGNOSIS — N899 Noninflammatory disorder of vagina, unspecified: Secondary | ICD-10-CM | POA: Diagnosis present

## 2018-12-11 DIAGNOSIS — B9689 Other specified bacterial agents as the cause of diseases classified elsewhere: Secondary | ICD-10-CM

## 2018-12-11 DIAGNOSIS — N760 Acute vaginitis: Secondary | ICD-10-CM | POA: Insufficient documentation

## 2018-12-11 DIAGNOSIS — Z91040 Latex allergy status: Secondary | ICD-10-CM | POA: Diagnosis not present

## 2018-12-11 DIAGNOSIS — Z79899 Other long term (current) drug therapy: Secondary | ICD-10-CM | POA: Insufficient documentation

## 2018-12-11 DIAGNOSIS — N938 Other specified abnormal uterine and vaginal bleeding: Secondary | ICD-10-CM | POA: Diagnosis not present

## 2018-12-11 DIAGNOSIS — Z888 Allergy status to other drugs, medicaments and biological substances status: Secondary | ICD-10-CM | POA: Diagnosis not present

## 2018-12-11 LAB — URINALYSIS, ROUTINE W REFLEX MICROSCOPIC
Bilirubin Urine: NEGATIVE
Glucose, UA: NEGATIVE mg/dL
Ketones, ur: NEGATIVE mg/dL
Leukocytes,Ua: NEGATIVE
Nitrite: NEGATIVE
Protein, ur: NEGATIVE mg/dL
Specific Gravity, Urine: <1.005 — ABNORMAL LOW (ref 1.005–1.030)
pH: 6 (ref 5.0–8.0)

## 2018-12-11 LAB — URINALYSIS, MICROSCOPIC (REFLEX)

## 2018-12-11 LAB — WET PREP, GENITAL
Sperm: NONE SEEN
Trich, Wet Prep: NONE SEEN
Yeast Wet Prep HPF POC: NONE SEEN

## 2018-12-11 LAB — PREGNANCY, URINE: Preg Test, Ur: NEGATIVE

## 2018-12-11 MED ORDER — METRONIDAZOLE 500 MG PO TABS: 500.0000 mg | ORAL_TABLET | Freq: Two times a day (BID) | ORAL | 0 refills | Status: AC

## 2018-12-11 NOTE — ED Triage Notes (Signed)
Vaginal discharge since last week. Denies pain.

## 2018-12-11 NOTE — Discharge Instructions (Addendum)
Your wet prep showed signs of bacterial vaginosis.  This is not an STD.  It is an overgrowth of bacteria that normally lives within the vagina.  It is treated with Flagyl which is an antibiotic.  You will take this twice a day for 7 days.  Please take all of your antibiotics until finished!   Take your antibiotics with food.  Common side effects of antibiotics include nausea, vomiting, abdominal discomfort, and diarrhea. You may help offset some of this with probiotics which you can buy or get in yogurt. Do not eat  or take the probiotics until 2 hours after your antibiotic.    Some studies suggest that certain antibiotics can reduce the efficacy of certain oral contraceptive pills (birth control), so please use additional contraceptives (condoms or other barrier method) while you are taking the antibiotics and for an additional 5 to 7 days afterwards if you are a female on these medications.  Drink plenty fluids get plenty of rest.  Your remaining cultures for gonorrhea, chlamydia, HIV, and syphilis will resolve within about 48 hours.  You will receive a phone call if any of your test results are positive, no phone call if your test results are negative.  Follow-up with OB/GYN or PCP for reevaluation of symptoms.  Return to the emergency department immediately if any concerning signs or symptoms develop such as high fevers, persistent vomiting, or worsening pain.

## 2018-12-11 NOTE — ED Notes (Signed)
Pt. Reports she does not have normal periods any longer per the Pt. Said due to endometriosis

## 2018-12-11 NOTE — ED Provider Notes (Signed)
[facility] EMERGENCY DEPARTMENT Provider Note   CSN: [id number] Arrival date & time: 12/11/18  1734     History   Chief Complaint Chief Complaint  Patient presents with  . Vaginal Discharge    HPI [name] is a 33 y.o. female with history of endometriosis presents for evaluation of acute onset, resolved vaginal discharge for 1 week and vaginal spotting for 2 days.  She has a history of abnormal uterine bleeding secondary to her Nexplanon implant and states this is not unusual for her.  She reports the spotting is not significantly heavier than usual.  Around 2 weeks ago she experienced 1 week of abnormal thick white discharge which she treated with over-the-counter Monistat with resolution of her symptoms.  She denies any abdominal pain, urinary symptoms, fever, nausea, vomiting, diarrhea, constipation, or pelvic pain.  She is currently sexually active with one female partner and does not always use protection.  She is requesting testing for STDs.     The history is provided by the patient.    Past Medical History:  Diagnosis Date  . Endometriosis     There are no active problems to display for this patient.   Past Surgical History:  Procedure Laterality Date  . CESAREAN SECTION       OB History   No obstetric history on file.      Home Medications    Prior to Admission medications   Medication Sig Start Date End Date Taking? Authorizing Provider  meloxicam (MOBIC) 15 MG tablet Take 1 tablet (15 mg total) by mouth daily. 01/23/12   Arthor CaptainHarris, Abigail, PA-C  methocarbamol (ROBAXIN) 750 MG tablet Take 1 tablet (750 mg total) by mouth 4 (four) times daily. 01/23/12   Harris, Abigail, PA-C  metroNIDAZOLE (FLAGYL) 500 MG tablet Take 1 tablet (500 mg total) by mouth 2 (two) times daily. 12/11/18   Jeanie SewerFawze, Herron Fero A, PA-C    Family History No family history on file.  Social History Social History   Tobacco Use  . Smoking status: Never Smoker  . Smokeless  tobacco: Never Used  Substance Use Topics  . Alcohol use: No  . Drug use: No     Allergies   Benadryl [diphenhydramine hcl], Stadol [butorphanol tartrate], and Latex   Review of Systems Review of Systems  Constitutional: Negative for chills and fever.  Respiratory: Negative for shortness of breath.   Cardiovascular: Negative for chest pain.  Gastrointestinal: Negative for abdominal pain, nausea and vomiting.  Genitourinary: Positive for vaginal bleeding and vaginal discharge. Negative for dysuria, frequency, hematuria, urgency and vaginal pain.  All other systems reviewed and are negative.    Physical Exam Updated Vital Signs BP 109/75 (BP Location: Right Arm)   Pulse 72   Temp 98.4 F (36.9 C) (Oral)   Resp 15   Ht 5\' 6"  (1.676 m)   Wt 68 kg   SpO2 99%   BMI 24.21 kg/m   Physical Exam Vitals signs and nursing note reviewed.  Constitutional:      General: She is not in acute distress.    Appearance: She is well-developed.     Comments: Resting comfortably in bed  HENT:     Head: Normocephalic and atraumatic.  Eyes:     General:        Right eye: No discharge.        Left eye: No discharge.     Conjunctiva/sclera: Conjunctivae normal.  Neck:     Vascular: No JVD.  Trachea: No tracheal deviation.  Cardiovascular:     Rate and Rhythm: Normal rate and regular rhythm.  Pulmonary:     Effort: Pulmonary effort is normal.     Breath sounds: Normal breath sounds.  Abdominal:     General: Abdomen is flat. Bowel sounds are normal. There is no distension.     Palpations: Abdomen is soft.     Tenderness: There is no abdominal tenderness. There is no right CVA tenderness, left CVA tenderness, guarding or rebound.  Genitourinary:    Comments: Examination performed in the presence of a chaperone.  No masses or lesions to the external genitalia.  Moderate amount of rust colored discharge in the vaginal vault.  No cervical motion tenderness or adnexal tenderness.  No  cervical friability. Skin:    General: Skin is warm and dry.     Findings: No erythema.  Neurological:     Mental Status: She is alert.  Psychiatric:        Behavior: Behavior normal.      ED Treatments / Results  Labs (all labs ordered are listed, but only abnormal results are displayed) Labs Reviewed  WET PREP, GENITAL - Abnormal; Notable for the following components:      Result Value   Clue Cells Wet Prep HPF POC PRESENT (*)    WBC, Wet Prep HPF POC FEW (*)    All other components within normal limits  URINALYSIS, ROUTINE W REFLEX MICROSCOPIC - Abnormal; Notable for the following components:   Color, Urine STRAW (*)    Specific Gravity, Urine <1.005 (*)    Hgb urine dipstick LARGE (*)    All other components within normal limits  URINALYSIS, MICROSCOPIC (REFLEX) - Abnormal; Notable for the following components:   Bacteria, UA RARE (*)    All other components within normal limits  PREGNANCY, URINE  RPR  HIV ANTIBODY (ROUTINE TESTING W REFLEX)  GC/CHLAMYDIA PROBE AMP () NOT AT [facility]    EKG None  Radiology No results found.  Procedures Procedures (including critical care time)  Medications Ordered in ED Medications - No data to display   Initial Impression / Assessment and Plan / ED Course  I have reviewed the triage vital signs and the nursing notes.  Pertinent labs & imaging results that were available during my care of the patient were reviewed by me and considered in my medical decision making (see chart for details).        Patient presenting today for 1 week of resolved abnormal vaginal discharge and 2 days of mild vaginal spotting.  The spotting is not unusual for her while she has had her Nexplanon for the past 2 years.  She denies any abdominal pain and physical examination is reassuring with no peritoneal signs or discomfort on palpation.  She is afebrile, vital signs are stable.  Nontoxic in appearance.  Pelvic examination shows a small  amount of rust colored discharge consistent with history of spotting.  UA shows no evidence of UTI, small amount of blood which is likely contaminant from vaginal secretions.  Her wet prep shows evidence of BV so we will treat with Flagyl.  No evidence of PID, doubt TOA, ovarian torsion, or ectopic pregnancy.  No evidence of acute surgical abdominal pathology on exam.  Recommend follow-up with OB/GYN for reevaluation of symptoms.  Discussed strict ED return precautions. Patient verbalized understanding of and agreement with plan and is safe for discharge home at this time.   Final Clinical Impressions(s) /  ED Diagnoses   Final diagnoses:  BV (bacterial vaginosis)  DUB (dysfunctional uterine bleeding)    ED Discharge Orders         Ordered    metroNIDAZOLE (FLAGYL) 500 MG tablet  2 times daily     12/11/18 2208           Renita Papa, PA-C 12/12/18 1633    Davonna Belling, MD 12/14/18 1507

## 2018-12-13 LAB — GC/CHLAMYDIA PROBE AMP (~~LOC~~) NOT AT ARMC
Chlamydia: NEGATIVE
Neisseria Gonorrhea: NEGATIVE

## 2018-12-13 LAB — RPR: RPR Ser Ql: NONREACTIVE

## 2018-12-13 LAB — HIV ANTIBODY (ROUTINE TESTING W REFLEX): HIV Screen 4th Generation wRfx: NONREACTIVE
